# Patient Record
Sex: Female | Born: 2000
Health system: Southern US, Community
[De-identification: ages and names within clinical notes are randomized; demographics above are authoritative.]

## PROBLEM LIST (undated history)

## (undated) DIAGNOSIS — F32A Depression, unspecified: Secondary | ICD-10-CM

## (undated) DIAGNOSIS — F3181 Bipolar II disorder: Secondary | ICD-10-CM

## (undated) HISTORY — PX: SHOULDER SURGERY: SHX246

## (undated) HISTORY — DX: Bipolar II disorder: F31.81

## (undated) HISTORY — PX: TONSILLECTOMY: SUR1361

## (undated) HISTORY — DX: Depression, unspecified: F32.A

---
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
## 2020-12-22 IMAGING — CR CHEST - 2 VIEW
2 series · 2 of 2 positions shown · non-contrast
Comparison: None.

CLINICAL DATA: Exercise-induced asthma

EXAM:
CHEST - 2 VIEW

[w chest pa]
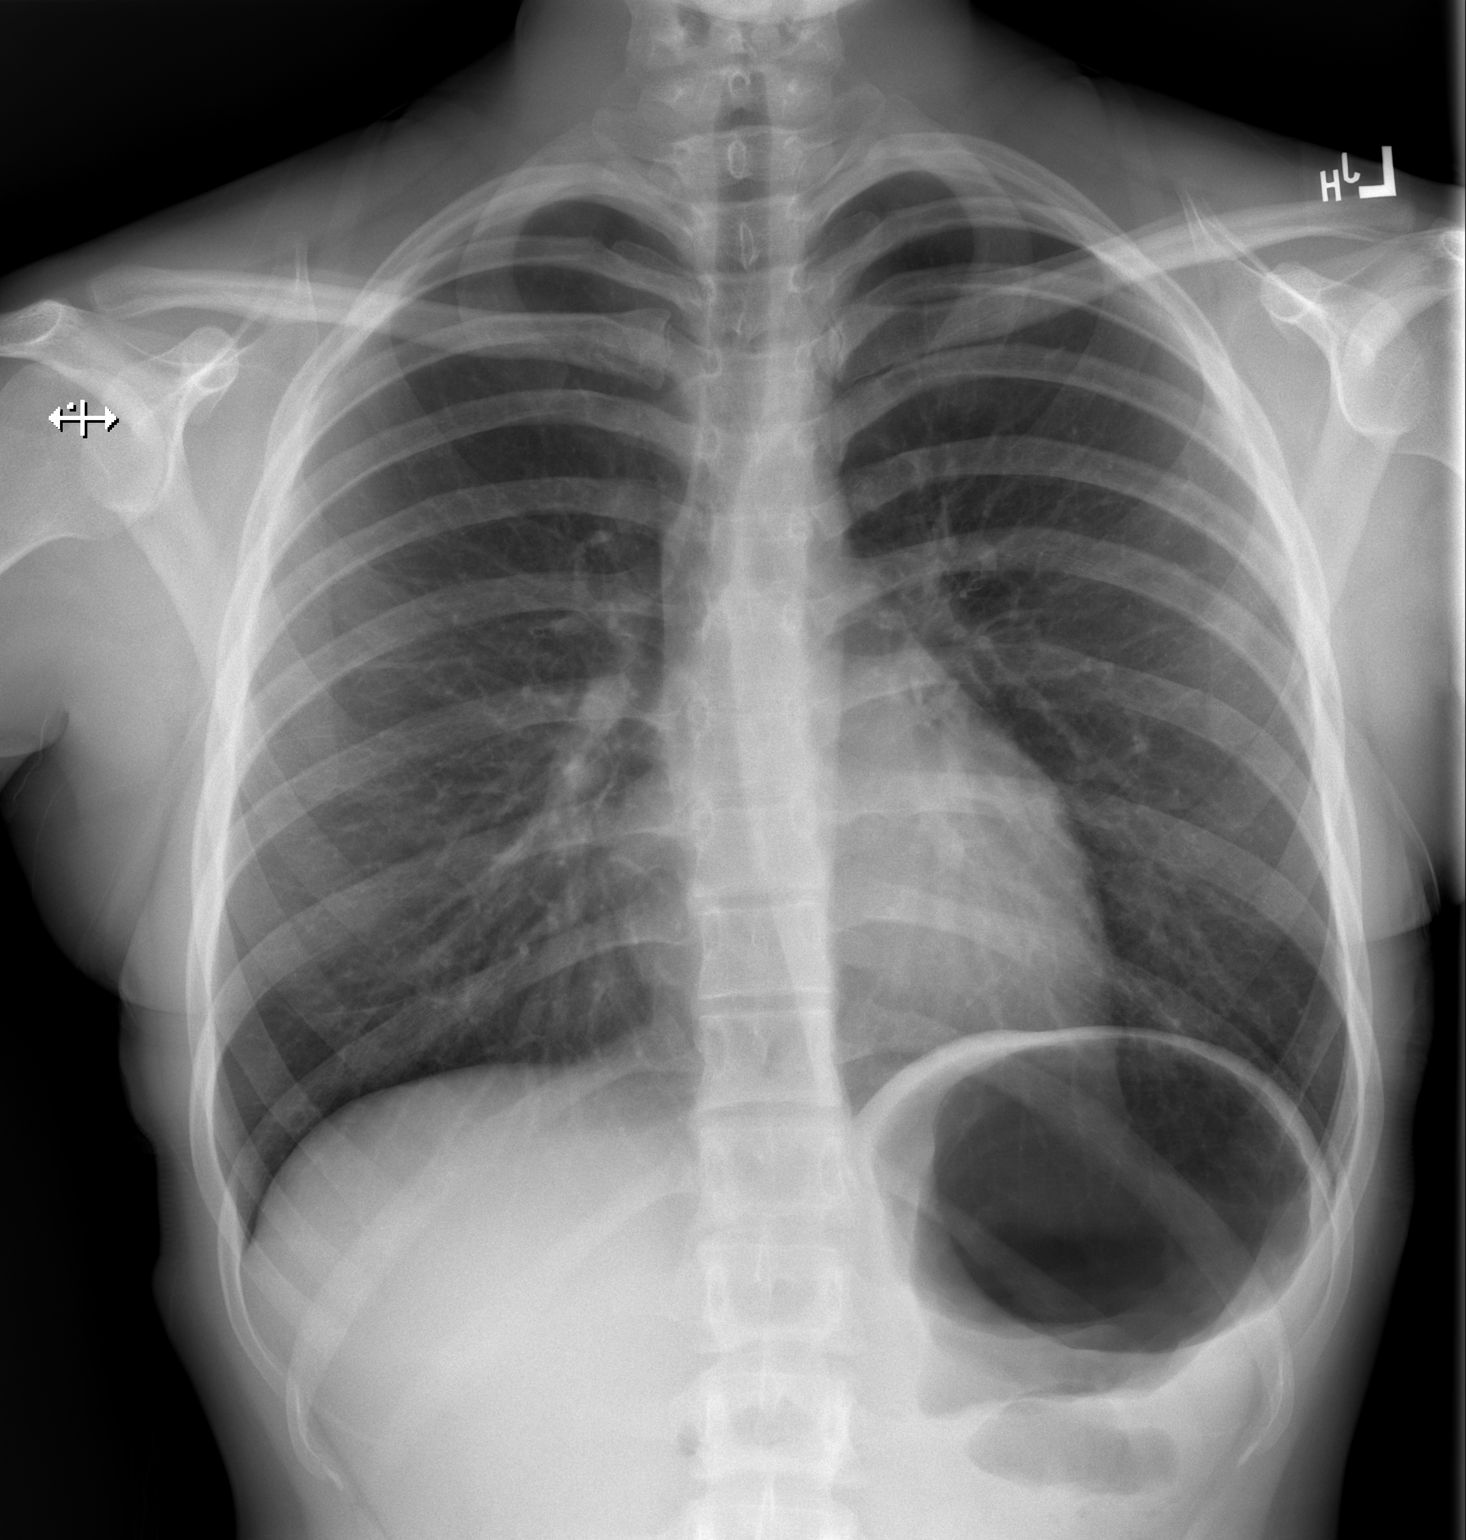

[w chest lat]
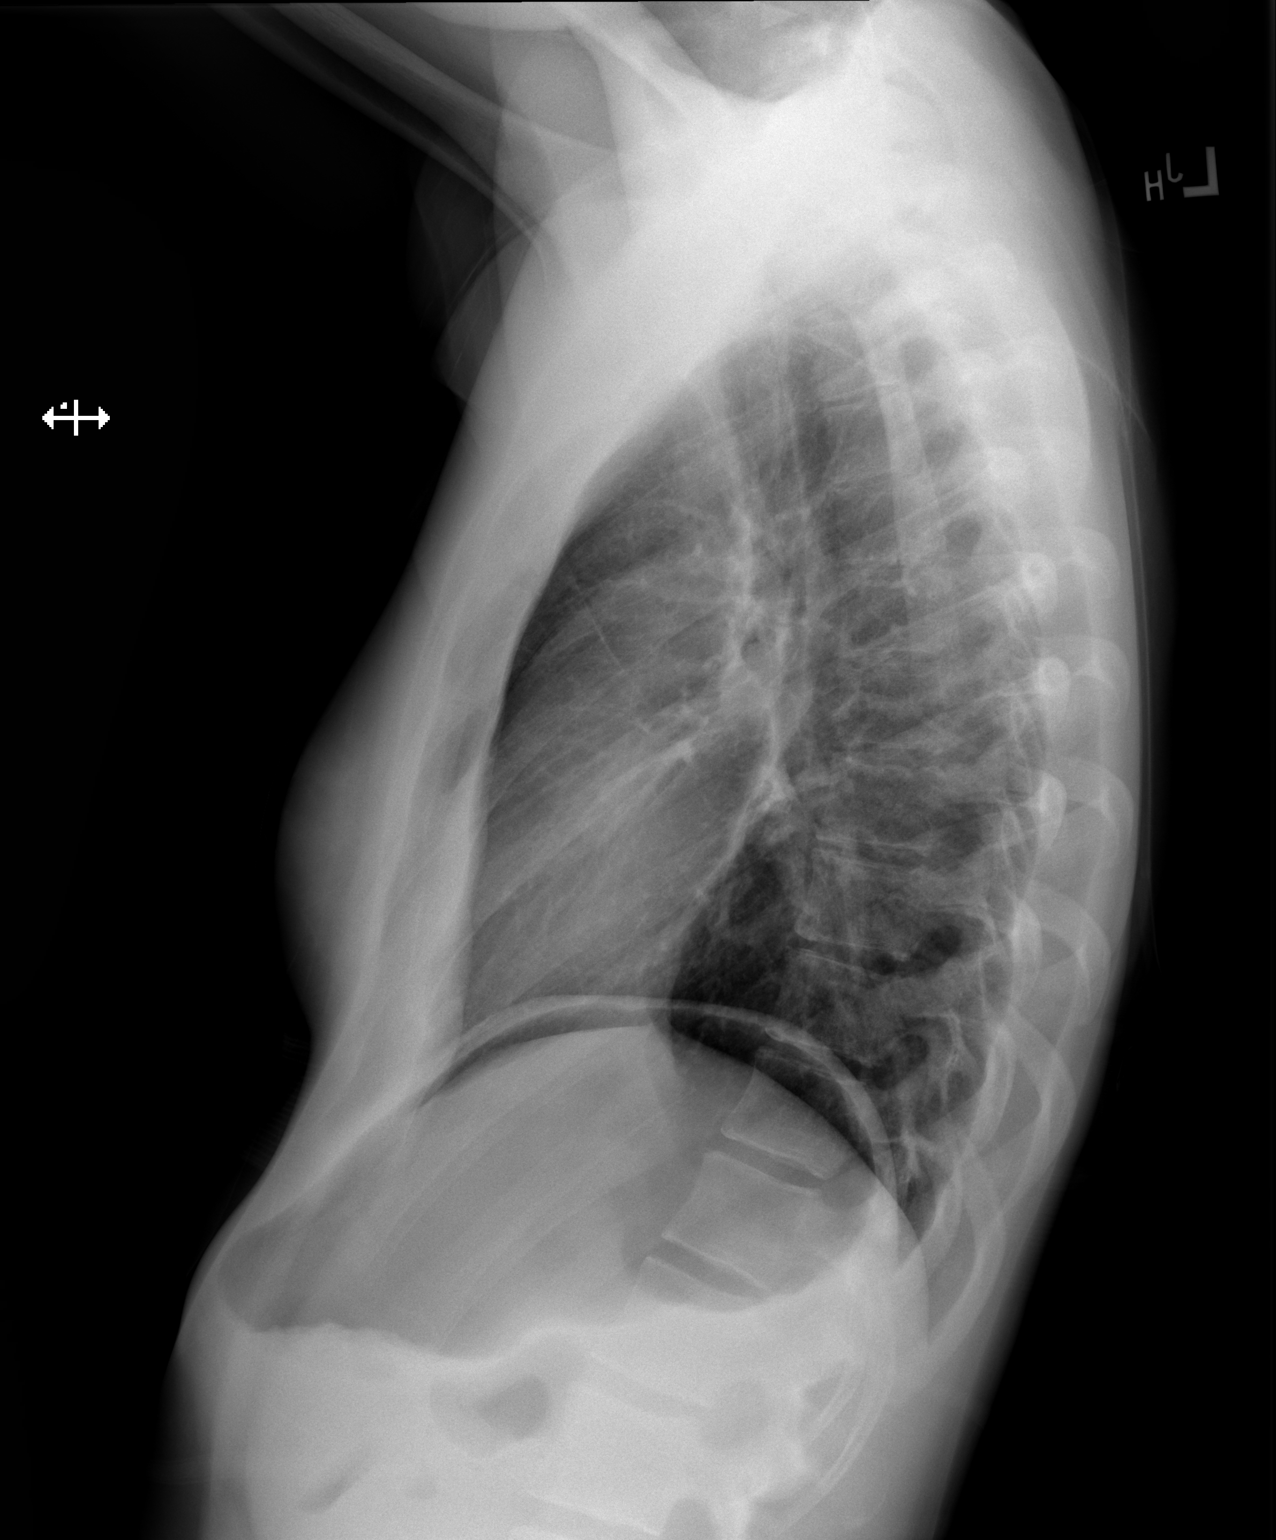

[2 of 2 positions shown; findings below may reference images not displayed]

FINDINGS: The heart size and mediastinal contours are within normal limits.
Both lungs are clear. The visualized skeletal structures are
unremarkable.
IMPRESSION: No active cardiopulmonary disease.

## 2023-03-15 ENCOUNTER — Telehealth: Payer: Self-pay | Admitting: Gastroenterology

## 2023-03-15 NOTE — Telephone Encounter (Signed)
Inbound call from patient requesting to transfer her care. Patient last seen in 2023 with [facility]. Patient wishing to be seen for possible IBS with chronic constipation. Requesting to transfer care due to feel as though her symptoms were not being addressed and have not been relieved. Patient will have previous records sent for review.

## 2023-03-17 ENCOUNTER — Ambulatory Visit: Payer: 59 | Attending: Family Medicine

## 2023-03-17 ENCOUNTER — Other Ambulatory Visit: Payer: Self-pay | Admitting: *Deleted

## 2023-03-17 DIAGNOSIS — R002 Palpitations: Secondary | ICD-10-CM

## 2023-03-17 NOTE — Progress Notes (Unsigned)
Enrolled for [employer] to mail a ZIO XT long term holter monitor to the patients address on file.   EP to read.

## 2023-03-21 DIAGNOSIS — R002 Palpitations: Secondary | ICD-10-CM | POA: Diagnosis not present

## 2023-04-20 NOTE — Progress Notes (Signed)
Cardiology Office Note:  .   Date:  04/22/2023  ID:  Beth Howard, DOB 2000/04/09, MRN [id number] PCP: Beth Goodpasture, NP  [facility] Providers Cardiologist:  None    History of Present Illness: .   Beth Howard is a 23 y.o. female with history of PCOS who presents for the evaluation of palpitations at the request of Beth Goodpasture, NP.   History of Present Illness   Beth Howard, a 23 year old with a history of PCOS, depression, anxiety, and an eating disorder, presents with tachycardia and dizziness. The symptoms have been occurring for the past couple of months and are particularly noticeable at night upon standing. The patient describes the dizziness as a sensation of everything going black and speckled, and it sometimes leads to imbalance and falling over. The tachycardia episodes are random and distinct from the patient's usual anxiety symptoms.  The patient is currently on multiple medications including Prozac, Buspar, hydroxyzine, Lamictal, trazodone, and Tirzepatide (Zepbound). All these medications have been stable and none of them are new. The patient also reports daily minor headaches that resolve within 15 minutes. She maintains a hydration level of about 80 ounces of water per day and exercises two to three times a week. She does not consume excess caffeine, alcohol, or drugs.   No CP or SOB. She can exercise without limitations.       HGB 11.9 TSH 1.12  Zio 03/17/2023 Normal     Problem List PCOS    ROS: All other ROS reviewed and negative. Pertinent positives noted in the HPI.     Studies Reviewed: Marland Kitchen   EKG Interpretation Date/Time:  Friday April 22 2023 11:28:43 EST Ventricular Rate:  79 PR Interval:  130 QRS Duration:  78 QT Interval:  400 QTC Calculation: 458 R Axis:   76  Text Interpretation: Normal sinus rhythm Normal ECG Confirmed by Lennie Odor [id number]) on 04/22/2023 11:31:04 AM   Physical Exam:   VS:  BP (!) 90/58 (BP Location:  Right Arm, Patient Position: Sitting, Cuff Size: Normal)   Pulse 79   Ht 5' 7.5" (1.715 m)   Wt 152 lb (68.9 kg)   BMI 23.46 kg/m    Wt Readings from Last 3 Encounters:  04/22/23 152 lb (68.9 kg)  04/24/19 163 lb (73.9 kg) (90%, Z= 1.31)*   * Growth percentiles are based on CDC (Girls, 2-20 Years) data.    GEN: Well nourished, well developed in no acute distress NECK: No JVD; No carotid bruits CARDIAC: RRR, no murmurs, rubs, gallops RESPIRATORY:  Clear to auscultation without rales, wheezing or rhonchi  ABDOMEN: Soft, non-tender, non-distended EXTREMITIES:  No edema; No deformity  ASSESSMENT AND PLAN: .   Assessment and Plan    Dizziness and Tachycardia Symptoms occur at night upon standing and during random episodes. Possible interaction of multiple psychoactive medications (Buspar, Prozac, Hydroxyzine, Lamictal, Trazodone) and Tirzepatide. Discussed possibility of Postural Orthostatic Tachycardia Syndrome (POTS), but this is a diagnosis of exclusion and patient has multiple other potential contributing factors. -recent monitor without arrhythmias. TSH and CBC normal.  -Order echocardiogram to ensure structural normality of the heart. -Advise patient to work with primary care physician and psychologist to reassess medications, with a potential start of discontinuing Tirzepatide. -Advise patient to reach out if symptoms do not improve. -I have recommended a conservative approach to re-evaluate depression medications. She will hydrate and able to increase salt intake.  -Tachycardia is not arrhythmia related and likely secondary to polypharmacy.   -She  will see [place] back as needed based on her echo              Follow-up: Return if symptoms worsen or fail to improve.   Signed, Lenna Gilford. Flora Lipps, MD, [facility]  [facility]  [address], [address] [place], [place] [address] [phone]  11:51 AM

## 2023-04-22 ENCOUNTER — Encounter: Payer: Self-pay | Admitting: Cardiovascular Disease

## 2023-04-22 ENCOUNTER — Other Ambulatory Visit: Payer: Self-pay

## 2023-04-22 ENCOUNTER — Ambulatory Visit: Payer: 59 | Attending: Cardiovascular Disease | Admitting: Cardiovascular Disease

## 2023-04-22 VITALS — BP 90/58 | HR 79 | Ht 67.5 in | Wt 152.0 lb

## 2023-04-22 DIAGNOSIS — R002 Palpitations: Secondary | ICD-10-CM

## 2023-04-22 DIAGNOSIS — R000 Tachycardia, unspecified: Secondary | ICD-10-CM

## 2023-04-22 DIAGNOSIS — R42 Dizziness and giddiness: Secondary | ICD-10-CM | POA: Diagnosis not present

## 2023-04-22 NOTE — Patient Instructions (Addendum)
Medication Instructions:  Your physician recommends that you continue on your current medications as directed. Please refer to the Current Medication list given to you today.  *If you need a refill on your cardiac medications before your next appointment, please call your pharmacy*    Testing/Procedures: Echo will be scheduled at [address] [facility] [address].  Your physician has requested that you have an echocardiogram. Echocardiography is a painless test that uses sound waves to create images of your heart. It provides your doctor with information about the size and shape of your heart and how well your heart's chambers and valves are working. This procedure takes approximately one hour. There are no restrictions for this procedure. Please do NOT wear cologne, perfume, aftershave, or lotions (deodorant is allowed). Please arrive 15 minutes prior to your appointment time.    Follow-Up: At [facility], you and your health needs are our priority.  As part of our continuing mission to provide you with exceptional heart care, we have created designated Provider Care Teams.  These Care Teams include your primary Cardiologist (physician) and Advanced Practice Providers (APPs -  Physician Assistants and Nurse Practitioners) who all work together to provide you with the care you need, when you need it.  We recommend signing up for the patient portal called "MyChart".  Sign up information is provided on this After Visit Summary.  MyChart is used to connect with patients for Virtual Visits (Telemedicine).  Patients are able to view lab/test results, encounter notes, upcoming appointments, etc.  Non-urgent messages can be sent to your provider as well.   To learn more about what you can do with MyChart, go to [email or web address].    Your next appointment:   As needed  Provider:   Dr. Flora Lipps

## 2023-05-25 ENCOUNTER — Ambulatory Visit (HOSPITAL_COMMUNITY)
Admission: RE | Admit: 2023-05-25 | Payer: 59 | Source: Ambulatory Visit | Attending: Cardiovascular Disease | Admitting: Cardiovascular Disease

## 2023-06-24 ENCOUNTER — Ambulatory Visit (HOSPITAL_COMMUNITY)
Admission: RE | Admit: 2023-06-24 | Discharge: 2023-06-24 | Disposition: A | Discharge status: Home / Self Care | Payer: 59 | Source: Ambulatory Visit | Attending: Cardiovascular Disease | Admitting: Cardiovascular Disease

## 2023-06-24 DIAGNOSIS — I3139 Other pericardial effusion (noninflammatory): Secondary | ICD-10-CM | POA: Diagnosis not present

## 2023-06-24 DIAGNOSIS — R42 Dizziness and giddiness: Secondary | ICD-10-CM | POA: Diagnosis not present

## 2023-06-25 LAB — ECHOCARDIOGRAM COMPLETE
AR max vel: 2.77 cm2
AV Area VTI: 2.58 cm2
AV Area mean vel: 2.67 cm2
AV Mean grad: 2 mmHg
AV Peak grad: 4.2 mmHg
Ao pk vel: 1.03 m/s
Area-P 1/2: 5.27 cm2
MV M vel: 0.72 m/s
MV Peak grad: 2.1 mmHg
S' Lateral: 3.03 cm

## 2023-06-26 ENCOUNTER — Encounter: Payer: Self-pay | Admitting: Cardiovascular Disease

## 2023-08-24 ENCOUNTER — Telehealth (INDEPENDENT_AMBULATORY_CARE_PROVIDER_SITE_OTHER): Payer: Self-pay | Admitting: Otolaryngology

## 2023-08-24 NOTE — Telephone Encounter (Signed)
 Called and left Vm regarding new location address and appointment time.

## 2023-08-25 ENCOUNTER — Ambulatory Visit (INDEPENDENT_AMBULATORY_CARE_PROVIDER_SITE_OTHER): Admitting: Otolaryngology

## 2023-08-25 VITALS — BP 104/68 | HR 86

## 2023-08-25 DIAGNOSIS — K224 Dyskinesia of esophagus: Secondary | ICD-10-CM | POA: Diagnosis not present

## 2023-08-25 DIAGNOSIS — R1310 Dysphagia, unspecified: Secondary | ICD-10-CM | POA: Diagnosis not present

## 2023-08-25 NOTE — Progress Notes (Signed)
 ENT CONSULT:  Reason for Consult: inability to burp    HPI: Discussed the use of AI scribe software for clinical note transcription with the patient, who gave verbal consent to proceed.  History of Present Illness Beth Howard is a 23 year old female who presents with a lifelong inability to burp and worsening symptoms.  She has experienced a lifelong inability to burp, which has recently worsened. She describes significant discomfort and pressure in her throat, attempting to relieve it by opening her mouth to let air gurgle out, often resulting in a sound 'like a frog.' Aggressive hiccups accompany these symptoms.  She avoids drinking water during the day at work due to these symptoms, which occur with any liquid and after eating foods that typically induce burping in others. She feels the sensation of needing to burp but is unable to do so, leading to discomfort and sometimes nausea if the symptoms are severe.  No issues with swallowing liquids or solid foods, voice changes, heartburn, or chest pain. She reports frequent bloating but does not pass a lot of gas. She has not undergone any gastrointestinal procedures or swallow testing in the past.   Records Reviewed:  ENT office visit   Chief Complaint  Patient presents with  Retrograde cricopharyngeal dysfunction   23 year old female who feels a chronic pressure in her throat and a feeling that she cannot burp which has been present for her entire life. She will also have some occasional difficulty swallowing but no voice changes. She did see GI in the past who told him that it was related to an upper aerodigestive cause. She has not had any imaging specifically related to this   Assessment/Plan  1. Dysphagia, unspecified type (Primary) She has nonspecific symptoms of dysphagia and upper digestive symptom dysfunction. Cricopharyngeal dysfunction is a consideration. Laryngoscopy today was unremarkable. A modified barium swallow was  ordered and we discussed the referral to laryngology. A referral was put in to Dr. Kip Peon today.  2. Dysmotility We discussed that she has some symptoms which can be seen in dysmotility. Will consider reevaluation of GI evaluation after the modified barium swallow.  No orders of the defined types were placed in this encounter.  No follow-ups on file.    Past Medical History:  Diagnosis Date   Bipolar 2 disorder (HCC)    Depression     Past Surgical History:  Procedure Laterality Date   SHOULDER SURGERY     TONSILLECTOMY      Family History  Problem Relation Age of Onset   Thyroid  disease Mother     Social History:  reports that she has never smoked. She has never used smokeless tobacco. She reports that she does not currently use alcohol. She reports that she does not use drugs.  Allergies: Not on File  Medications: I have reviewed the patient's current medications.  The PMH, PSH, Medications, Allergies, and SH were reviewed and updated.  ROS: Constitutional: Negative for fever, weight loss and weight gain. Cardiovascular: Negative for chest pain and dyspnea on exertion. Respiratory: Is not experiencing shortness of breath at rest. Gastrointestinal: Negative for nausea and vomiting. Neurological: Negative for headaches. Psychiatric: The patient is not nervous/anxious  Blood pressure 104/68, pulse 86, SpO2 98%. There is no height or weight on file to calculate BMI.  PHYSICAL EXAM:  Exam: General: Well-developed, well-nourished Communication and Voice: Clear pitch and clarity Respiratory Respiratory effort: Equal inspiration and expiration without stridor Cardiovascular Peripheral Vascular: Warm extremities with equal color/perfusion Eyes:  No nystagmus with equal extraocular motion bilaterally Neuro/Psych/Balance: Patient oriented to person, place, and time; Appropriate mood and affect; Gait is intact with no imbalance; Cranial nerves I-XII are intact Head and  Face Inspection: Normocephalic and atraumatic without mass or lesion Palpation: Facial skeleton intact without bony stepoffs Salivary Glands: No mass or tenderness Facial Strength: Facial motility symmetric and full bilaterally ENT Pinna: External ear intact and fully developed External canal: Canal is patent with intact skin Tympanic Membrane: Clear and mobile External Nose: No scar or anatomic deformity Internal Nose: Septum is S-shaped. No polyp, or purulence. Mucosal edema and erythema present.  Bilateral inferior turbinate hypertrophy.  Lips, Teeth, and gums: Mucosa and teeth intact and viable TMJ: No pain to palpation with full mobility Oral cavity/oropharynx: No erythema or exudate, no lesions present Nasopharynx: No mass or lesion with intact mucosa Hypopharynx: Intact mucosa without pooling of secretions Larynx Glottic: Full true vocal cord mobility without lesion or mass Supraglottic: Normal appearing epiglottis and AE folds Interarytenoid Space: Moderate pachydermia&edema Subglottic Space: Patent without lesion or edema Neck Neck and Trachea: Midline trachea without mass or lesion Thyroid : No mass or nodularity Lymphatics: No lymphadenopathy  Procedure: Preoperative diagnosis: inability to burp, dysphagia   Postoperative diagnosis:   Same  Procedure: Flexible fiberoptic laryngoscopy  Surgeon: Birgitta Uhlir, MD  Anesthesia: Topical lidocaine and Afrin Complications: None Condition is stable throughout exam  Indications and consent:  The patient presents to the clinic with above symptoms. Indirect laryngoscopy view was incomplete. Thus it was recommended that they undergo a flexible fiberoptic laryngoscopy. All of the risks, benefits, and potential complications were reviewed with the patient preoperatively and verbal informed consent was obtained.  Procedure: The patient was seated upright in the clinic. Topical lidocaine and Afrin were applied to the nasal  cavity. After adequate anesthesia had occurred, I then proceeded to pass the flexible telescope into the nasal cavity. The nasal cavity was patent without rhinorrhea or polyp. The nasopharynx was also patent without mass or lesion. The base of tongue was visualized and was normal. There were no signs of pooling of secretions in the piriform sinuses. The true vocal folds were mobile bilaterally. There were no signs of glottic or supraglottic mucosal lesion or mass. There was moderate interarytenoid pachydermia and post cricoid edema. The telescope was then slowly withdrawn and the patient tolerated the procedure throughout.      Studies Reviewed: CXR 11/07/2018 FINDINGS: The heart size and mediastinal contours are within normal limits. Both lungs are clear. The visualized skeletal structures are unremarkable  Assessment/Plan: Encounter Diagnoses  Name Primary?   Dysphagia, unspecified type Yes   Cricopharyngeus muscle dysfunction     Assessment and Plan Assessment & Plan No burp syndrome suspected based on sx, some dysphagia sx  Her sx could be related to crycopharyngeal dysfunction but cannot exclude other causes without esophagram. No prior trial of PPIs. Will consider in the future. No EGD in the past, will consider in the future.  Chronic inability to burp causing discomfort and aggressive hiccups. Differential includes esophageal mechanical issues, but suspicion is low. Consider Botox injection for therapeutic and diagnostic purposes. - Order esophagram to rule out esophageal mechanical issues. - Consider Botox injection into the upper esophageal sphincter under general anesthesia if esophagram is unremarkable. - Follow up after esophagram to discuss results and next steps.    Thank you for allowing me to participate in the care of this patient. Please do not hesitate to contact me with any questions or  concerns.   Artice Last, MD Otolaryngology [facility] Phone: [phone] Fax: [phone]    08/25/2023, 3:41 PM

## 2023-10-11 ENCOUNTER — Telehealth (INDEPENDENT_AMBULATORY_CARE_PROVIDER_SITE_OTHER): Payer: Self-pay

## 2023-10-11 NOTE — Telephone Encounter (Signed)
 Left a VM for patient to give us  a call back regarding upcoming appointment and orders not being filled.

## 2023-10-13 ENCOUNTER — Ambulatory Visit (INDEPENDENT_AMBULATORY_CARE_PROVIDER_SITE_OTHER): Admitting: Otolaryngology

## 2023-11-07 ENCOUNTER — Ambulatory Visit (HOSPITAL_COMMUNITY)
Admission: RE | Admit: 2023-11-07 | Discharge: 2023-11-07 | Disposition: A | Discharge status: Home / Self Care | Source: Ambulatory Visit | Attending: Otolaryngology | Admitting: Otolaryngology

## 2023-11-07 ENCOUNTER — Other Ambulatory Visit (INDEPENDENT_AMBULATORY_CARE_PROVIDER_SITE_OTHER): Payer: Self-pay | Admitting: Otolaryngology

## 2023-11-07 ENCOUNTER — Encounter (HOSPITAL_COMMUNITY): Payer: Self-pay

## 2023-11-07 DIAGNOSIS — R1310 Dysphagia, unspecified: Secondary | ICD-10-CM | POA: Insufficient documentation

## 2023-11-07 DIAGNOSIS — K224 Dyskinesia of esophagus: Secondary | ICD-10-CM

## 2024-06-06 ENCOUNTER — Ambulatory Visit (INDEPENDENT_AMBULATORY_CARE_PROVIDER_SITE_OTHER)

## 2024-07-20 ENCOUNTER — Ambulatory Visit: Admitting: Physician Assistant
